# Patient Record
Sex: Male | Born: 1965 | Race: White | Hispanic: No | Marital: Married | Smoking: Never smoker
Health system: Southern US, Community
[De-identification: ages and names within clinical notes are randomized; demographics above are authoritative.]

## PROBLEM LIST (undated history)

## (undated) DIAGNOSIS — K5090 Crohn's disease, unspecified, without complications: Secondary | ICD-10-CM

## (undated) DIAGNOSIS — D531 Other megaloblastic anemias, not elsewhere classified: Secondary | ICD-10-CM

## (undated) HISTORY — DX: Other megaloblastic anemias, not elsewhere classified: D53.1

## (undated) HISTORY — PX: OTHER SURGICAL HISTORY: SHX169

## (undated) HISTORY — DX: Crohn's disease, unspecified, without complications: K50.90

---

## 2005-05-31 ENCOUNTER — Ambulatory Visit: Payer: Self-pay | Admitting: Gastroenterology

## 2006-07-02 ENCOUNTER — Ambulatory Visit: Payer: Self-pay | Admitting: Gastroenterology

## 2006-07-02 LAB — CONVERTED CEMR LAB
ALT: 24 units/L (ref 0–53)
Basophils Absolute: 0 10*3/uL (ref 0.0–0.1)
Bilirubin, Direct: 0.1 mg/dL (ref 0.0–0.3)
Calcium: 9.1 mg/dL (ref 8.4–10.5)
GFR calc Af Amer: 94 mL/min
GFR calc non Af Amer: 78 mL/min
Glucose, Bld: 89 mg/dL (ref 70–99)
Hemoglobin: 14.1 g/dL (ref 13.0–17.0)
MCHC: 33.9 g/dL (ref 30.0–36.0)
Monocytes Absolute: 0.7 10*3/uL (ref 0.2–0.7)
Monocytes Relative: 8.9 % (ref 3.0–11.0)
RBC: 4.7 M/uL (ref 4.22–5.81)
RDW: 11.8 % (ref 11.5–14.6)
Vitamin B-12: >1500 pg/mL — ABNORMAL HIGH (ref 211–911)

## 2007-04-26 DIAGNOSIS — R197 Diarrhea, unspecified: Secondary | ICD-10-CM | POA: Insufficient documentation

## 2007-04-26 DIAGNOSIS — K56609 Unspecified intestinal obstruction, unspecified as to partial versus complete obstruction: Secondary | ICD-10-CM | POA: Insufficient documentation

## 2007-04-26 DIAGNOSIS — D649 Anemia, unspecified: Secondary | ICD-10-CM

## 2007-08-08 ENCOUNTER — Telehealth: Payer: Self-pay | Admitting: Gastroenterology

## 2007-08-12 ENCOUNTER — Ambulatory Visit: Payer: Self-pay | Admitting: Gastroenterology

## 2007-08-12 DIAGNOSIS — K5000 Crohn's disease of small intestine without complications: Secondary | ICD-10-CM | POA: Insufficient documentation

## 2007-08-13 ENCOUNTER — Encounter: Payer: Self-pay | Admitting: Gastroenterology

## 2007-08-13 LAB — CONVERTED CEMR LAB
Alkaline Phosphatase: 52 units/L (ref 39–117)
BUN: 17 mg/dL (ref 6–23)
Basophils Relative: 0.9 % (ref 0.0–3.0)
Eosinophils Relative: 0.8 % (ref 0.0–5.0)
GFR calc Af Amer: 85 mL/min
GFR calc non Af Amer: 70 mL/min
Glucose, Bld: 93 mg/dL (ref 70–99)
HCT: 42.5 % (ref 39.0–52.0)
Hemoglobin: 14.7 g/dL (ref 13.0–17.0)
Monocytes Absolute: 0.8 10*3/uL (ref 0.1–1.0)
Monocytes Relative: 11.5 % (ref 3.0–12.0)
Neutro Abs: 3.7 10*3/uL (ref 1.4–7.7)
RBC: 4.74 M/uL (ref 4.22–5.81)
RDW: 11.8 % (ref 11.5–14.6)
Sed Rate: 12 mm/hr (ref 0–16)
Total Bilirubin: 1 mg/dL (ref 0.3–1.2)
WBC: 6.9 10*3/uL (ref 4.5–10.5)

## 2008-05-14 ENCOUNTER — Ambulatory Visit: Payer: Self-pay | Admitting: Gastroenterology

## 2008-05-14 DIAGNOSIS — R1011 Right upper quadrant pain: Secondary | ICD-10-CM | POA: Insufficient documentation

## 2008-05-17 LAB — CONVERTED CEMR LAB
ALT: 45 units/L (ref 0–53)
AST: 31 units/L (ref 0–37)
Albumin: 4 g/dL (ref 3.5–5.2)
Basophils Absolute: 0 10*3/uL (ref 0.0–0.1)
Basophils Relative: 0.8 % (ref 0.0–3.0)
CO2: 31 meq/L (ref 19–32)
Calcium: 9.4 mg/dL (ref 8.4–10.5)
Chloride: 109 meq/L (ref 96–112)
Creatinine, Ser: 1.1 mg/dL (ref 0.4–1.5)
GFR calc non Af Amer: 77.26 mL/min (ref >60)
Hemoglobin: 13.8 g/dL (ref 13.0–17.0)
Lymphocytes Relative: 29.7 % (ref 12.0–46.0)
Monocytes Relative: 8.8 % (ref 3.0–12.0)
Neutro Abs: 3.2 10*3/uL (ref 1.4–7.7)
Neutrophils Relative %: 59.4 % (ref 43.0–77.0)
Potassium: 3.8 meq/L (ref 3.5–5.1)
RBC: 4.32 M/uL (ref 4.22–5.81)
RDW: 11.8 % (ref 11.5–14.6)

## 2008-05-18 ENCOUNTER — Telehealth (INDEPENDENT_AMBULATORY_CARE_PROVIDER_SITE_OTHER): Payer: Self-pay | Admitting: *Deleted

## 2008-05-20 ENCOUNTER — Ambulatory Visit (HOSPITAL_COMMUNITY): Admission: RE | Admit: 2008-05-20 | Discharge: 2008-05-20 | Payer: Self-pay | Admitting: Gastroenterology

## 2008-05-20 ENCOUNTER — Telehealth: Payer: Self-pay | Admitting: Gastroenterology

## 2008-05-21 ENCOUNTER — Telehealth: Payer: Self-pay | Admitting: Gastroenterology

## 2009-08-01 HISTORY — PX: ACHILLES TENDON REPAIR: SUR1153

## 2009-08-01 HISTORY — PX: FOOT SURGERY: SHX648

## 2009-12-02 IMAGING — US US ABDOMEN COMPLETE
1 series · 13 of 25 positions shown · non-contrast
Comparison: None

CLINICAL DATA: Right upper quadrant pain, history of Crohn's
disease

ABDOMEN ULTRASOUND
TECHNIQUE: Abdominal ultrasound

[Series 1: us abdomen complete · 0.28mm/px · 13 of 98 slices shown]
[im 1/98]
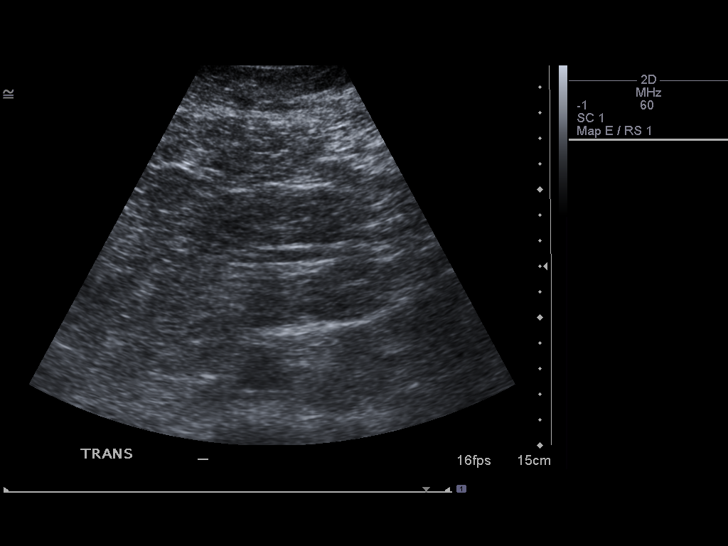
[im 9/98]
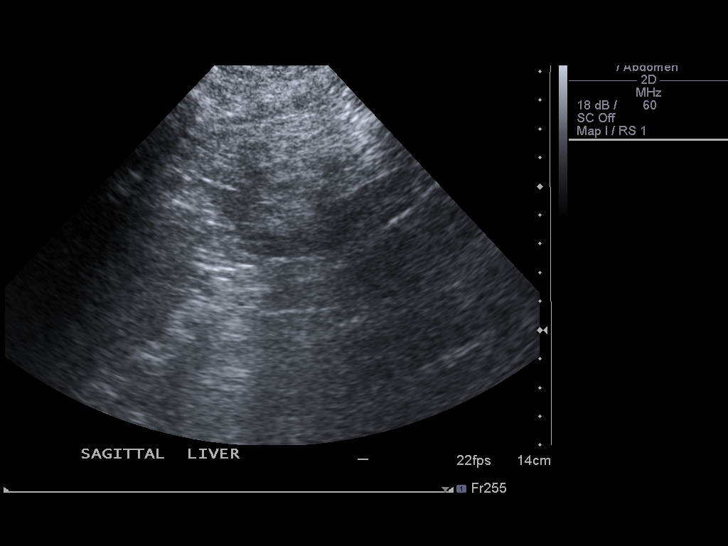
[im 17/98]
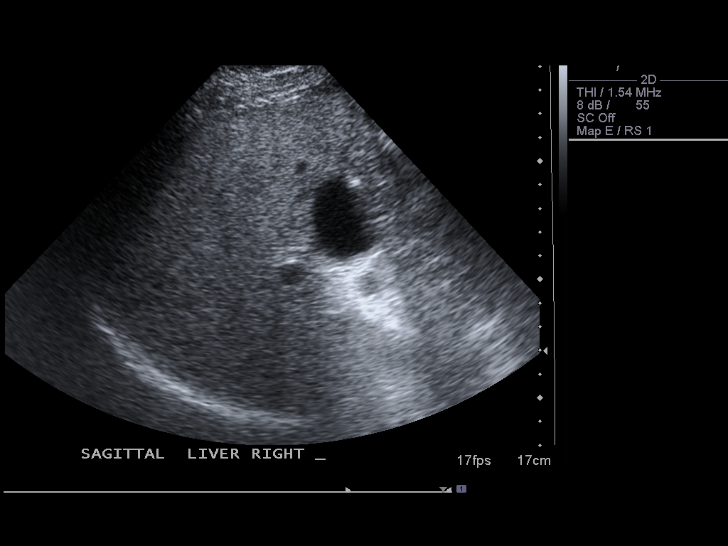
[im 25/98]
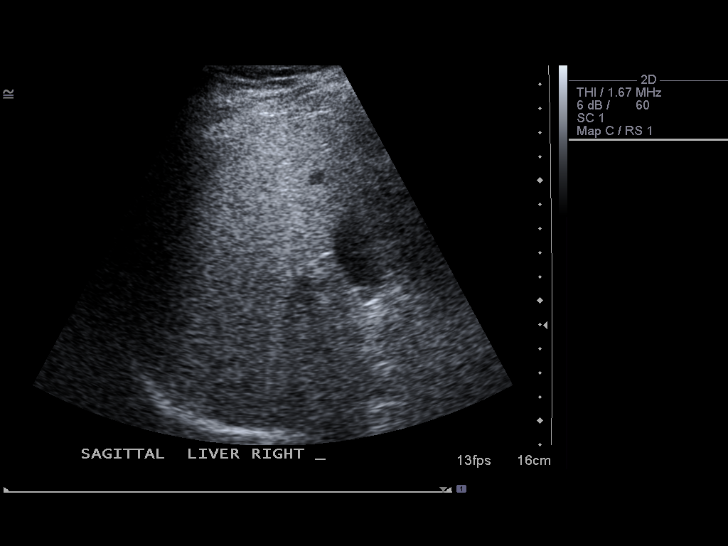
[im 33/98]
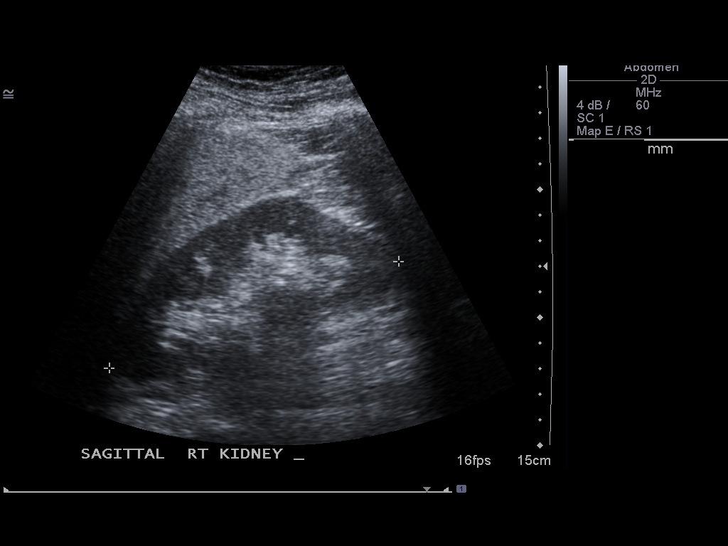
[im 41/98]
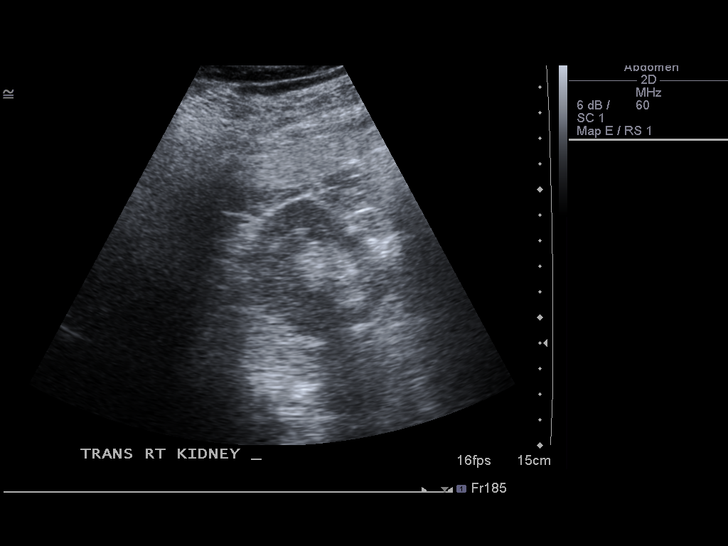
[im 49/98]
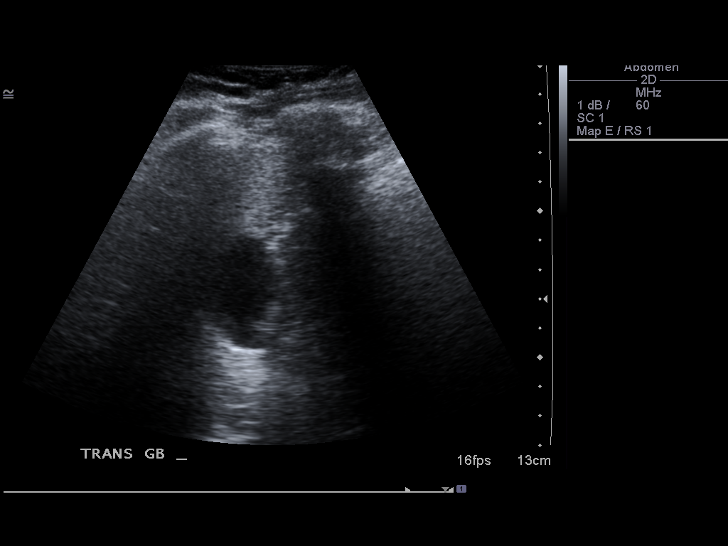
[im 57/98]
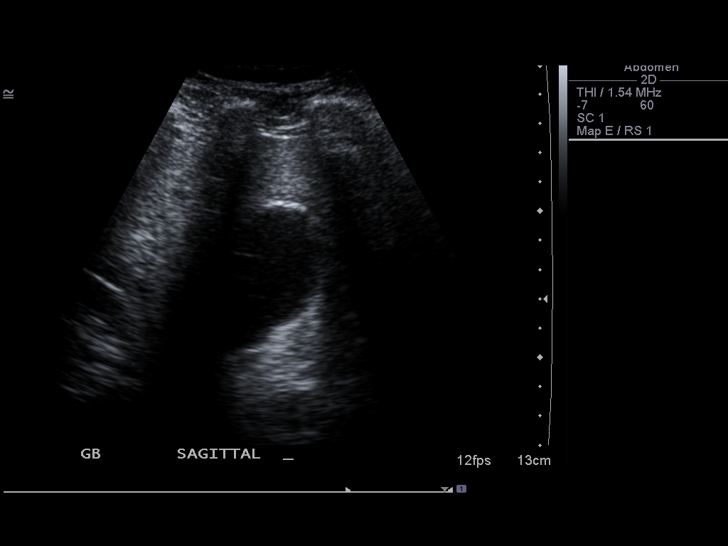
[im 65/98]
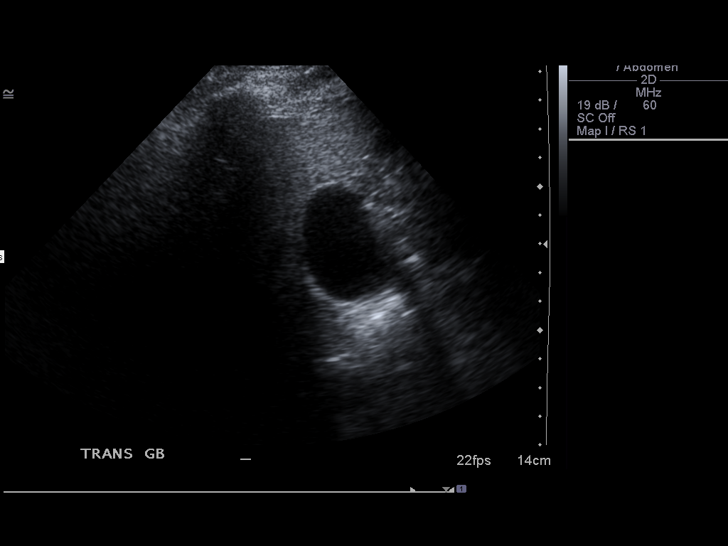
[im 73/98]
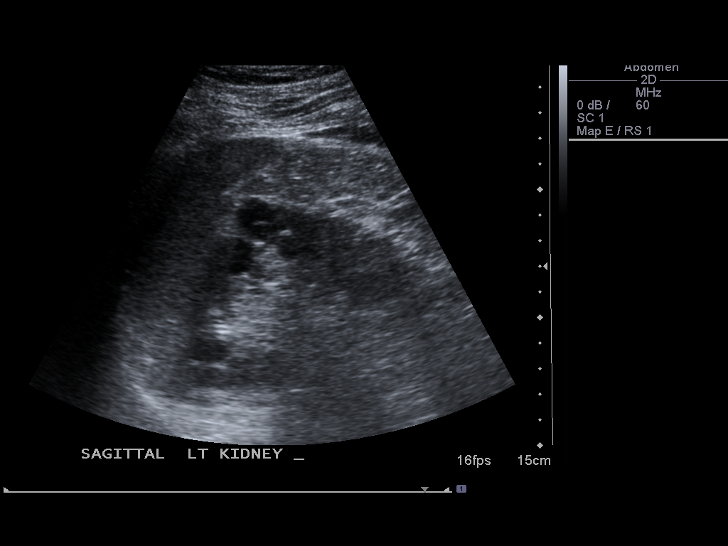
[im 81/98]
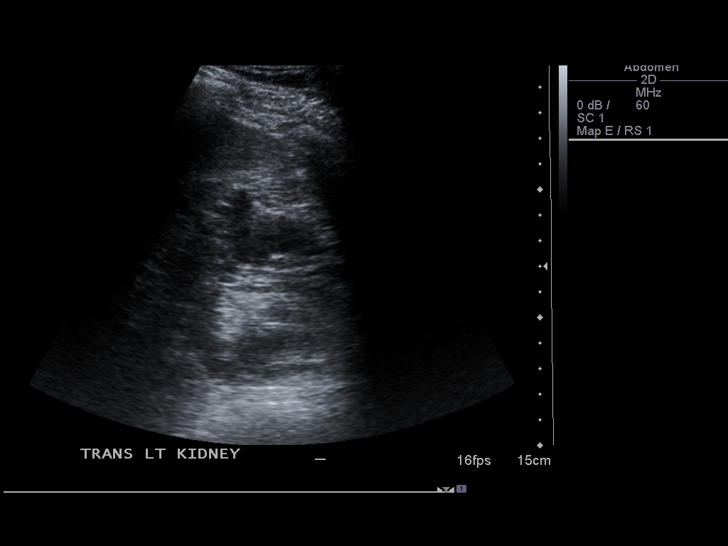
[im 89/98]
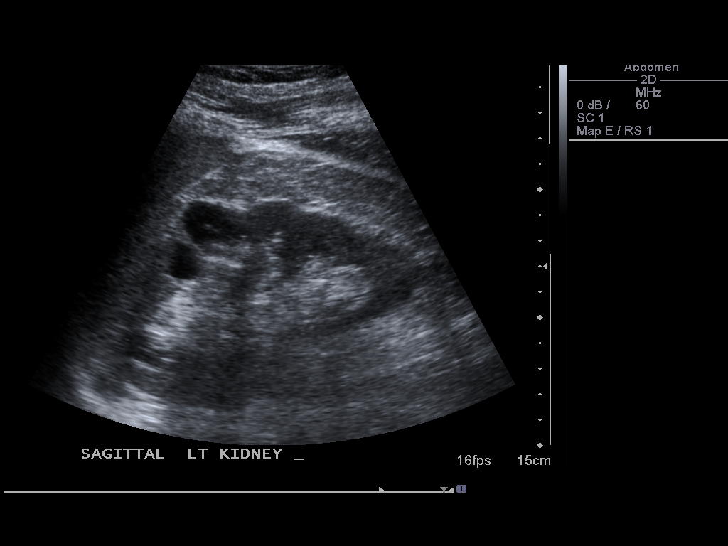
[im 98/98]
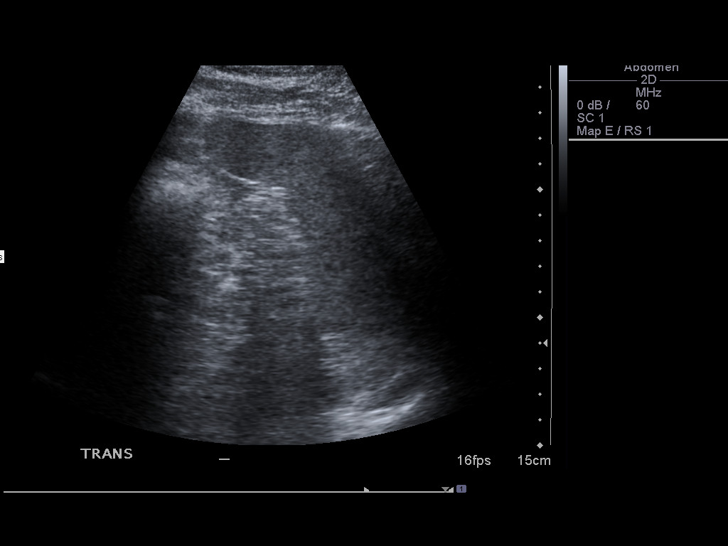

[13 of 25 positions shown; findings below may reference images not displayed]

FINDINGS: The liver shows diffuse increased echogenicity.  Findings
probable due to fatty infiltration.  There is limited assessment on
visualization of the left hepatic lobe.

No gallstones are noted within gallbladder.  CBD measures 4.1 mm in
diameter within normal limits.

IVC is not visualized.

Pancreas is not visualized.

The study is limited by abundant bowel gas.

The spleen shows normal echogenicity measures 8 cm in length.

Right kidney measures 12 cm in length.  No hydronephrosis or
diagnostic renal calculus.

Left kidney measures 12.9 cm in length.  A cyst in mid pole
measures 1 x 1.5 cm.  A probable complex cyst in mid pole laterally
measures 2.0 cm.  Follow-up ultrasound in 3 months is recommended
to assure stability.

No hydronephrosis or diagnostic renal calculus.

Abdominal aorta measures up to 2.1 cm in diameter.
IMPRESSION: 1.  Limited assessment due to abundant bowel gas.  Probable fatty
infiltration of the liver.  Left hepatic lobe is poorly visualized.
2.  No gallstones are noted within gallbladder.
3.  Pancreas and IVC are not visualized.
4.  No hydronephrosis or diagnostic renal calculus.  A cyst is
noted mid pole of the left kidney measures 1.4 cm.  Probable
complex cyst mid pole of the left kidney measures 2 cm.  Follow-up
ultrasound in 3 months is recommended to assure stability.

## 2010-01-23 ENCOUNTER — Encounter: Payer: Self-pay | Admitting: Gastroenterology

## 2010-03-10 ENCOUNTER — Telehealth: Payer: Self-pay | Admitting: Gastroenterology

## 2010-03-14 NOTE — Progress Notes (Signed)
Summary: Medication refill  Phone Note Call from Patient Call back at Home Phone [phone]   Caller: Patient Call For: Dr. Christella Hartigan Summary of Call: Needs a new script for Pensasa 90 day supply mail order...[facility] [phone] Initial call taken by: Karna Christmas,  March 10, 2010 3:47 PM  Follow-up for Phone Call        pt aware Follow-up by: Chales Abrahams CMA Duncan Dull),  March 10, 2010 4:57 PM    Prescriptions: PENTASA 500 MG  CPCR (MESALAMINE) take 1 pill twice daily  #180 x 3   Entered by:   Chales Abrahams CMA (AAMA)   Authorized by:   Rachael Fee MD   Signed by:   Chales Abrahams CMA (AAMA) on 03/10/2010   Method used:   Electronically to        [facility]* (mail-order)       [address] [place], [place]  [address]       Ph: [phone]       Fax: [phone]   RxID:   [id number]

## 2010-03-23 ENCOUNTER — Telehealth: Payer: Self-pay | Admitting: Gastroenterology

## 2010-03-23 NOTE — Telephone Encounter (Signed)
Pt scheduled for an appointment with Dr Christella Hartigan and was also put on the wait list.  I advised pt to call if symptoms worsen.

## 2010-05-03 ENCOUNTER — Other Ambulatory Visit (INDEPENDENT_AMBULATORY_CARE_PROVIDER_SITE_OTHER): Payer: Federal, State, Local not specified - PPO

## 2010-05-03 ENCOUNTER — Ambulatory Visit (INDEPENDENT_AMBULATORY_CARE_PROVIDER_SITE_OTHER): Payer: Federal, State, Local not specified - PPO | Admitting: Gastroenterology

## 2010-05-03 ENCOUNTER — Other Ambulatory Visit: Payer: Self-pay

## 2010-05-03 ENCOUNTER — Encounter: Payer: Self-pay | Admitting: Gastroenterology

## 2010-05-03 VITALS — BP 136/82 | HR 72 | Ht 71.0 in | Wt 220.0 lb

## 2010-05-03 DIAGNOSIS — R1011 Right upper quadrant pain: Secondary | ICD-10-CM

## 2010-05-03 LAB — COMPREHENSIVE METABOLIC PANEL
ALT: 57 U/L — ABNORMAL HIGH (ref 0–53)
AST: 31 U/L (ref 0–37)
Albumin: 3.9 g/dL (ref 3.5–5.2)
Alkaline Phosphatase: 60 U/L (ref 39–117)
Calcium: 9 mg/dL (ref 8.4–10.5)
Chloride: 105 mEq/L (ref 96–112)
Potassium: 4 mEq/L (ref 3.5–5.1)
Sodium: 142 mEq/L (ref 135–145)
Total Protein: 7.1 g/dL (ref 6.0–8.3)

## 2010-05-03 LAB — CBC WITH DIFFERENTIAL/PLATELET
Basophils Absolute: 0 10*3/uL (ref 0.0–0.1)
Basophils Relative: 0.4 % (ref 0.0–3.0)
Eosinophils Absolute: 0.1 10*3/uL (ref 0.0–0.7)
Lymphocytes Relative: 34 % (ref 12.0–46.0)
MCHC: 34 g/dL (ref 30.0–36.0)
MCV: 89.8 fl (ref 78.0–100.0)
Monocytes Absolute: 0.6 10*3/uL (ref 0.1–1.0)
Neutrophils Relative %: 54.1 % (ref 43.0–77.0)
Platelets: 169 10*3/uL (ref 150.0–400.0)
RBC: 4.43 Mil/uL (ref 4.22–5.81)
RDW: 12.9 % (ref 11.5–14.6)

## 2010-05-03 MED ORDER — MESALAMINE ER 500 MG PO CPCR: 500.0000 mg | ORAL_CAPSULE | Freq: Two times a day (BID) | ORAL | Status: DC

## 2010-05-03 NOTE — Telephone Encounter (Signed)
Refill pentasa

## 2010-05-03 NOTE — Progress Notes (Signed)
Review of gastrointestinal problems:  1. Likely Crohn's disease. Diagnosed in 1984, presenting with diarrhea, anemia, and weight loss. Underwent what sounds like an ileocectomy in 1987, small bowel obstruction surgery 1992. Most recently for the past 10 to 12 years he has been on 500 mg twice daily of Pentasa without any GI symptoms. [place] colon[place] August 2004 [place] found normal colon, friable but otherwise normal IC anastomosis. August, 2009 I recommended he given its limited small bowel fracture and lack of any empiric evidence of colonic disease, he declined. 2, Intermittent RUQ pains, 2010: abd US showed no gallstones, normal LFTs, recommended HIDA but he declined.  HPI: This is a very pleasant 45 year old man whom I last saw 1 or 2 years ago. He has been having a return of his right upper quadrant discomfort.   Had RUQ pressure then nausea, vomitting.  Fried foods tend to bring it on.  He declined HIDA in past.  The pais had gone away but have returned in past 6 months.  Occurs with most meals, at least 1-2 times a day.  Can last 30 min.  No problems with bowels.  No constipation, no diarrhea.     Physical Exam: BP 136/82  Pulse 72  Ht 5\' 11"  (1.803 m)  Wt 220 lb (99.791 kg)  BMI 30.68 kg/m2 Constitutional: generally well-appearing Psychiatric: alert and oriented x3 Abdomen: soft, nontender, nondistended, no obvious ascites, no peritoneal signs, normal bowel sounds    Assessment and plan: 45 y.o. male with right upper quadrant discomfort, intermittent.  This sounds biliary in origin. I once again recommended that we proceed with a HIDA scan to estimate his gallbladder ejection fraction. This time he is interested in going ahead with the test. He lost the basis of labs including a CBC, complete metabolic profile, B12 level. He wanted B12 shot today I will like to check to see if he is deficient first. If the HIDA scan is negative then I think we should proceed with  upper endoscopy.

## 2010-05-03 NOTE — Patient Instructions (Signed)
You will have labs checked today in the basement lab.  Please head down after you check out with the front desk  (cbc, cmet, B12 level). HIDA scan with CCK to estimate gallbladder ejection fraction (how well your GB works).

## 2010-05-16 NOTE — Assessment & Plan Note (Signed)
[facility]                         GASTROENTEROLOGY OFFICE NOTE   NAME:Cody Maynard, Cody Maynard                       MRN:          [id number]  DATE:07/02/2006                            DOB:          [birth year]    PRIMARY CARE PHYSICIAN:  Dr. Janece Canterbury.   GI PROBLEM LIST:  1. Likely Crohn's disease.  Diagnosed in 1984, presenting with      diarrhea, anemia, and weight loss. Underwent what sounds like an      ileocectomy in 1987, small bowel obstruction surgery 1992. Most      recently for the past 10 to 12 years he has been on 500 mg twice      daily of Pentasa without any GI symptoms.  Most recent colonoscopy      August 2004 in [place] found normal colon, friable but otherwise      normal IC anastomosis.   INTERVAL HISTORY:  I last saw Cody Maynard a little over a year ago,  that was for his initial evaluation.  He continues to do very well on  500 mg of Pentasa twice a day.  Specifically he has 2 soft formed brown  bowel movements everyday.  He has no diarrhea, no constipation, no  significant abdominal pains.  His weight has been relatively stable.  He  has had no eye symptoms.  He does feel like he has a sinus infection  right now and says a Z-Pak almost  always clears him up immediately  He  is out of Pentasa.  I quizzed him again and he is very specific that he  has never been told that he had colitis on any of his previous workups.   CURRENT MEDICINES:  Pentasa 500 mg twice daily.   PHYSICAL EXAMINATION:  Weight 204 pounds, blood pressure 122/80, pulse  64.  CONSTITUTIONAL:  Generally well-appearing.  NEUROLOGICAL:  Alert and oriented x4.  LUNGS:  Clear to auscultation bilaterally.  HEART:  Regular rate and rhythm.  ABDOMEN:  Soft, nontender, normal bowel sounds.   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   First he has never been told he has Crohn's colitis and his most recent  colonoscopy in 2004 said specifically that he did not have any  inflammation in his colon, so I think that his next colonoscopy would be  for routine screening purposes in 2014.  I will refill his Pentasa and I  will also give him a Z-Pak for his likely sinus infection.  He requested  B12 shot but I think that every other month is more than enough for him  for B12 since his B12 level when I checked him a year  ago was greater than 1500, that was on once monthly shots.  He will  follow up with me in 12 months or sooner if needed.     Rachael Fee, MD  Electronically Signed    DPJ/MedQ  DD: 07/02/2006  DT: 07/03/2006  Job #: [id number]   cc:   Janece Canterbury

## 2010-05-17 ENCOUNTER — Encounter (HOSPITAL_COMMUNITY)
Admission: RE | Admit: 2010-05-17 | Discharge: 2010-05-17 | Disposition: A | Discharge status: Home / Self Care | Payer: Federal, State, Local not specified - PPO | Source: Ambulatory Visit | Attending: Gastroenterology | Admitting: Gastroenterology

## 2010-05-17 DIAGNOSIS — R1011 Right upper quadrant pain: Secondary | ICD-10-CM | POA: Insufficient documentation

## 2010-05-17 MED ORDER — SINCALIDE 5 MCG IJ SOLR: 0.0200 ug/kg | Freq: Once | INTRAMUSCULAR | Status: DC

## 2010-05-17 MED ORDER — TECHNETIUM TC 99M MEBROFENIN IV KIT: 5.0000 | PACK | Freq: Once | INTRAVENOUS | Status: AC | PRN

## 2010-05-19 ENCOUNTER — Other Ambulatory Visit: Payer: Self-pay | Admitting: Gastroenterology

## 2010-05-19 ENCOUNTER — Telehealth: Payer: Self-pay | Admitting: Gastroenterology

## 2010-05-19 DIAGNOSIS — R1011 Right upper quadrant pain: Secondary | ICD-10-CM

## 2010-05-19 NOTE — Telephone Encounter (Signed)
See result note dated 05/19/10

## 2010-08-25 ENCOUNTER — Other Ambulatory Visit: Payer: Self-pay | Admitting: Gastroenterology

## 2010-11-10 ENCOUNTER — Other Ambulatory Visit: Payer: Self-pay | Admitting: Gastroenterology

## 2010-11-15 ENCOUNTER — Telehealth: Payer: Self-pay | Admitting: Gastroenterology

## 2010-11-15 DIAGNOSIS — E538 Deficiency of other specified B group vitamins: Secondary | ICD-10-CM

## 2010-11-15 NOTE — Telephone Encounter (Signed)
Dr Christella Hartigan can we refill his injectable B12 it looks like you stated he does not need this per his lab.

## 2010-11-16 NOTE — Telephone Encounter (Signed)
Pt aware to have labs  

## 2010-11-16 NOTE — Telephone Encounter (Signed)
b12 level 6 mo ago was normal.  Lets check b12 again to know if he needs injections.

## 2010-11-27 ENCOUNTER — Other Ambulatory Visit (INDEPENDENT_AMBULATORY_CARE_PROVIDER_SITE_OTHER): Payer: Federal, State, Local not specified - PPO

## 2010-11-27 DIAGNOSIS — E538 Deficiency of other specified B group vitamins: Secondary | ICD-10-CM

## 2010-11-27 LAB — VITAMIN B12: Vitamin B-12: 586 pg/mL (ref 211–911)

## 2010-11-28 ENCOUNTER — Telehealth: Payer: Self-pay | Admitting: Gastroenterology

## 2010-11-28 NOTE — Telephone Encounter (Signed)
Left message on machine to call back see result note

## 2011-03-14 ENCOUNTER — Telehealth: Payer: Self-pay | Admitting: Gastroenterology

## 2011-03-14 DIAGNOSIS — N281 Cyst of kidney, acquired: Secondary | ICD-10-CM

## 2011-03-14 NOTE — Telephone Encounter (Signed)
Left message on machine to call back  

## 2011-03-14 NOTE — Telephone Encounter (Signed)
Pt is having pain and would like to see someone regarding pain in his kidney.  Pt had [place] here last year and it showed a cyst on the kidney.  I am sending a referral and records to [facility] for the pt

## 2011-06-08 ENCOUNTER — Other Ambulatory Visit: Payer: Self-pay | Admitting: Gastroenterology

## 2011-08-02 ENCOUNTER — Telehealth: Payer: Self-pay | Admitting: Gastroenterology

## 2011-08-02 MED ORDER — MESALAMINE ER 500 MG PO CPCR: 500.0000 mg | ORAL_CAPSULE | Freq: Two times a day (BID) | ORAL | Status: DC

## 2011-08-02 NOTE — Telephone Encounter (Signed)
Medication sent to the mail order phrmacy

## 2011-11-29 IMAGING — NM NM HEPATO W/GB/PHARM/[PERSON_NAME]
1 series · 12 of 12 positions shown · non-contrast
Comparison: Abdominal ultrasound from 05/20/2008

CLINICAL DATA: Evaluate gallbladder function.

NUCLEAR MEDICINE HEPATOBILIARY IMAGING WITH GALLBLADDER EF
TECHNIQUE: Sequential images of the abdomen were obtained [DATE] minutes following intravenous administration of
radiopharmaceutical.  After slow intravenous infusion of
micrograms Cholecystokinin, gallbladder ejection fraction was
determined.
Radiopharmaceutical:  5.0 mCi Rc-RRm Choletec

[Series 1: antr · 4.46mm/px · 2 acquisitions, 12 frames shown]
[im 1/2]
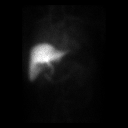
[im 1/2]
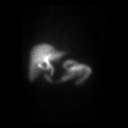
[im 1/2]
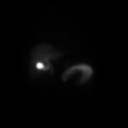
[im 1/2]
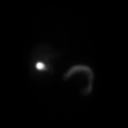
[im 1/2]
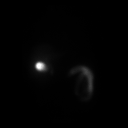
[im 1/2]
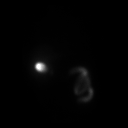
[im 2/2]
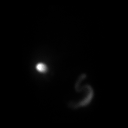
[im 2/2]
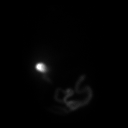
[im 2/2]
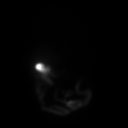
[im 2/2]
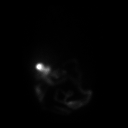
[im 2/2]
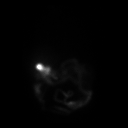
[im 2/2]
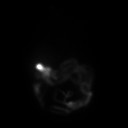

[12 of 12 positions shown; findings below may reference images not displayed]

FINDINGS: Radiopharmaceutical was taken up by the liver and
excreted into the biliary system.  There is activity within the
gallbladder and small bowel.  Gallbladder activity by 20 minutes.
The gallbladder ejection fraction is 39.4%.  Normal gallbladder
ejection fraction is greater than 30%.

The patient did experience symptoms during CCK infusion.
IMPRESSION: Normal gallbladder ejection fraction measuring 39%.

## 2011-12-10 ENCOUNTER — Telehealth: Payer: Self-pay | Admitting: Gastroenterology

## 2011-12-10 DIAGNOSIS — E538 Deficiency of other specified B group vitamins: Secondary | ICD-10-CM

## 2011-12-10 NOTE — Telephone Encounter (Signed)
Pt is due to have B12 level I have put the order in and he will come in and have that done this week

## 2011-12-14 ENCOUNTER — Other Ambulatory Visit (INDEPENDENT_AMBULATORY_CARE_PROVIDER_SITE_OTHER): Payer: Federal, State, Local not specified - PPO

## 2011-12-14 DIAGNOSIS — E538 Deficiency of other specified B group vitamins: Secondary | ICD-10-CM

## 2011-12-19 ENCOUNTER — Telehealth: Payer: Self-pay | Admitting: Gastroenterology

## 2011-12-19 NOTE — Telephone Encounter (Signed)
I have left a message for the patient to call back with any questions, but the results were normal .  See lab results from 12/10/11

## 2011-12-20 ENCOUNTER — Encounter: Payer: Self-pay | Admitting: Internal Medicine

## 2011-12-20 ENCOUNTER — Telehealth: Payer: Self-pay | Admitting: Gastroenterology

## 2011-12-20 NOTE — Telephone Encounter (Signed)
Pt was given the B 12 value

## 2013-06-01 ENCOUNTER — Ambulatory Visit (INDEPENDENT_AMBULATORY_CARE_PROVIDER_SITE_OTHER): Payer: Federal, State, Local not specified - PPO | Admitting: Internal Medicine

## 2013-06-01 ENCOUNTER — Encounter: Payer: Self-pay | Admitting: Internal Medicine

## 2013-06-01 VITALS — BP 142/98 | HR 88 | Temp 98.4°F | Resp 12 | Ht 71.5 in | Wt 231.0 lb

## 2013-06-01 DIAGNOSIS — N200 Calculus of kidney: Secondary | ICD-10-CM

## 2013-06-01 DIAGNOSIS — E213 Hyperparathyroidism, unspecified: Secondary | ICD-10-CM

## 2013-06-01 LAB — PHOSPHORUS: Phosphorus: 2.9 mg/dL (ref 2.3–4.6)

## 2013-06-01 LAB — MAGNESIUM: MAGNESIUM: 1.8 mg/dL (ref 1.5–2.5)

## 2013-06-01 NOTE — Patient Instructions (Signed)
Please stop at the lab. Please collect a urine collection - but only after we have the blood results back. I will let you know. Please try to join MyChart for easier communication. I will send you the labs through there.  Patient information (Up-to-Date): Collection of a 24-hour urine specimen  - You should collect every drop of urine during each 24-hour period. It does not matter how much or little urine is passed each time, as long as every drop is collected. - Begin the urine collection in the morning after you wake up, after you have emptied your bladder for the first time. - Urinate (empty the bladder) for the first time and flush it down the toilet. Note the exact time (eg, 6:15 AM). You will begin the urine collection at this time. - Collect every drop of urine during the day and night in an empty collection bottle. Store the bottle at room temperature or in the refrigerator. - If you need to have a bowel movement, any urine passed with the bowel movement should be collected. Try not to include feces with the urine collection. If feces does get mixed in, do not try to remove the feces from the urine collection bottle. - Finish by collecting the first urine passed the next morning, adding it to the collection bottle. This should be within ten minutes before or after the time of the first morning void on the first day (which was flushed). In this example, you would try to void between 6:05 and 6:25 on the second day. - If you need to urinate one hour before the final collection time, drink a full glass of water so that you can void again at the appropriate time. If you have to urinate 20 minutes before, try to hold the urine until the proper time. - Please note the exact time of the final collection, even if it is not the same time as when collection began on day 1. - The bottle(s) may be kept at room temperature for a day or two, but should be kept cool or refrigerated for longer periods of  time.  Please return in 3 months.

## 2013-06-01 NOTE — Progress Notes (Addendum)
Patient ID: Cody Maynard, male   DOB: [birth year], 48 y.o.   MRN: [id number]   HPI  Cody Maynard is a 48 y.o.-year-old male, referred by his urologist, Dr Mena GoesEskridge, in consultation for  Normocalcemic hyperparathyroidism.  Pt was dx with hypercalcemia on 05/18/2013, at his urologist's office - labs reviewed: PTH 80.6, calcium 9.7 Lab Results  Component Value Date   CALCIUM 9.0 05/03/2010   CALCIUM 9.4 05/14/2008   CALCIUM 9.9 08/12/2007   CALCIUM 9.1 07/02/2006   No h/o vitamin D deficiency, but no vit D level available for review.  No fractures or falls.   + significant h/o bilateral kidney stones, for which he is followed by Dr Mena GoesEskridge. He passed the first stone 1.5 years ago and 1/2 stone 1 mo ago. I do not have a stone analysis report available.  No h/o CKD. Last BMP: Glu 73, BUN/Cr 14/1.19, Na 140, K 3.9, Cl 103, CO2 27, Ca 9.7, GFR 71. He had a high Uric acid: 9.5 (4-7.8) Lab Results  Component Value Date   BUN 11 05/03/2010   CREATININE 1.3 05/03/2010   Pt is on calcium and vitamin D (one a day vitamin); he also eats limited amts of dairy (he is lactose intolerant) and limited green, leafy, vegetables - b/c (small intestine) Crohns ds - has a h/o small bowel resection in 1989 and 1992  - in remission since then.   Pt does not have a FH of hypercalcemia, pituitary tumors, thyroid cancer. His father had Paget's ds.   ROS: Constitutional: no weight gain/loss, no fatigue, no subjective hyperthermia/hypothermia Eyes: no blurry vision, no xerophthalmia ENT: no sore throat, no nodules palpated in throat, no dysphagia/odynophagia, no hoarseness Cardiovascular: no CP/SOB/palpitations/leg swelling Respiratory: no cough/SOB Gastrointestinal: no N/V/D/C Musculoskeletal: no muscle/joint aches Skin: no rashes Neurological: no tremors/numbness/tingling/dizziness Psychiatric: no depression/anxiety  Past Medical History  Diagnosis Date  . Crohn's   . Vitamin B12 deficient  megaloblastic anemia    Past Surgical History  Procedure Laterality Date  . Ileocectomy      1987  . Small bowel obstruction surgery      1992  . Foot surgery  08/2009    heal and bone spur  . Achilles tendon repair  08/2009   History   Social History  . Marital Status: Married    Spouse Name: N/A    Number of Children: 3   Occupational History  . Manager [employer]   Social History Main Topics  . Smoking status: Never Smoker   . Smokeless tobacco: Never Used  . Alcohol Use: Not on file     Comment: OCCASIONALLY  . Drug Use: No   Current Outpatient Prescriptions on File Prior to Visit  Medication Sig Dispense Refill  . CYANOCOBALAMIN IJ Inject 1,000 mg/mL as directed every 30 (thirty) days.        . mesalamine (PENTASA) 500 MG CR capsule Take 1 capsule (500 mg total) by mouth 2 (two) times daily.  180 capsule  3   No current facility-administered medications on file prior to visit.   Allergies  Allergen Reactions  . Penicillins     REACTION: sob   Family History  Problem Relation Age of Onset  . Heart disease Father     PE: BP 142/98  Pulse 88  Temp(Src) 98.4 F (36.9 C) (Oral)  Resp 12  Ht 5' 11.5" (1.816 m)  Wt 231 lb (104.781 kg)  BMI 31.77 kg/m2  SpO2 97% Wt Readings from Last 3 Encounters:  06/01/13 231 lb (104.781 kg)  05/03/10 220 lb (99.791 kg)  05/14/08 221 lb (100.245 kg)   Constitutional: overweight, in NAD. No kyphosis. Eyes: PERRLA, EOMI, no exophthalmos ENT: moist mucous membranes, no thyromegaly, no cervical lymphadenopathy Cardiovascular: RRR, No MRG Respiratory: CTA B Gastrointestinal: abdomen soft, NT, ND, BS+ Musculoskeletal: no deformities, strength intact in all 4 Skin: moist, warm, no rashes Neurological: no tremor with outstretched hands, DTR normal in all 4  Assessment: 1. Hyperparathyroidism  Plan: Patient has had normal calcium levels over the years but repeated nephrolithiasis episodes. He was recently found to  have a PTH level that was high, at 80.6.  It is unclear whether he has vitamin D deficiency >> will check today. He has Crohn's ds, may have a degree of malabsorption. Denies osteoporosis, no fractures. No abdominal pain, depression, bone pain. - I discussed with the patient about the physiology of calcium and parathyroid hormone, and possible side effects from increased PTH, including kidney stones, osteoporosis, abdominal pain, etc.  - He meets criteria for parathyroid surgery in case this is Primary HPTH:  Increased calcium by more than 1 mg/dL above the upper limit of normal  Kidney ds.  Osteoporosis  Age <56 years old - he is 48 y/o - Today we will check: calcium level intact PTH Magnesium Phosphorus vitamin D If vit D normal >> check 24h urinary calcium/creatinine ratio  - If the tests indicate a parathyroid adenoma, I will refer him to surgery. I will wait for the results of the above labs and will discuss with the plan with the patient.  - I will see the patient back in 3 months, I will discuss through my chart or by phone  Component     Latest Ref Rng 06/01/2013  Vitamin D 1, 25 (OH) Total     18 - 72 pg/mL 40  Vitamin D3 1, 25 (OH)      40  Vitamin D2 1, 25 (OH)      <8  PTH     14.0 - 72.0 pg/mL 87.6 (H)  Calcium     8.4 - 10.5 mg/dL 9.5  Vit D, 43-PIRJJOA     30 - 89 ng/mL 32  Phosphorus     2.3 - 4.6 mg/dL 2.9  Magnesium     1.5 - 2.5 mg/dL 1.8   Will go ahead and do the urine collection. Will addend labs when they are back.  Component     Latest Ref Rng 06/03/2013  Calcium, Ur      4  Urine Volume     100 - 250 mg/day 52 (L)  Creatinine, Urine      158.6  Creatinine, 24H Ur     800 - 2000 mg/day 2062 (H)   Urinary calcium low, which can mean to possible scenarios: - Familial hypocalciuric hypercalcemia - less likely with normal serum calcium - calcium malabsorption - most likely, with his h/o Crohn's ds.  Will advise the pt to start vit D OTC 1000 IU  daily and also to add a calcium 500 mg tablet a day.  Will repeat labs when he comes back in 3 mo.

## 2013-06-02 LAB — PTH, INTACT AND CALCIUM
Calcium: 9.5 mg/dL (ref 8.4–10.5)
PTH: 87.6 pg/mL — AB (ref 14.0–72.0)

## 2013-06-02 LAB — VITAMIN D 25 HYDROXY (VIT D DEFICIENCY, FRACTURES): Vit D, 25-Hydroxy: 32 ng/mL (ref 30–89)

## 2013-06-03 ENCOUNTER — Other Ambulatory Visit: Payer: Federal, State, Local not specified - PPO

## 2013-06-03 LAB — VITAMIN D 1,25 DIHYDROXY
VITAMIN D3 1, 25 (OH): 40 pg/mL
Vitamin D 1, 25 (OH)2 Total: 40 pg/mL (ref 18–72)
Vitamin D2 1, 25 (OH)2: <8 pg/mL

## 2013-06-04 ENCOUNTER — Encounter: Payer: Self-pay | Admitting: *Deleted

## 2013-06-04 LAB — CREATININE, URINE, 24 HOUR
CREATININE 24H UR: 2062 mg/d — AB (ref 800–2000)
Creatinine, Urine: 158.6 mg/dL

## 2013-06-04 LAB — CALCIUM, URINE, 24 HOUR
CALCIUM UR: 4 mg/dL
Calcium, 24 hour urine: 52 mg/d — ABNORMAL LOW (ref 100–250)

## 2013-06-05 ENCOUNTER — Telehealth: Payer: Self-pay | Admitting: Internal Medicine

## 2013-06-05 NOTE — Telephone Encounter (Signed)
Patient would like to know if his labs are available  Please advise   Thank You :)

## 2013-06-05 NOTE — Telephone Encounter (Signed)
Called pt and advised him of his results. Advised pt that letters were sent as well. Pt understood.

## 2013-10-05 ENCOUNTER — Ambulatory Visit: Payer: Self-pay | Admitting: Podiatry

## 2013-10-19 ENCOUNTER — Ambulatory Visit (INDEPENDENT_AMBULATORY_CARE_PROVIDER_SITE_OTHER): Payer: Federal, State, Local not specified - PPO | Admitting: Podiatry

## 2013-10-19 ENCOUNTER — Ambulatory Visit: Payer: Self-pay

## 2013-10-19 ENCOUNTER — Encounter: Payer: Self-pay | Admitting: Podiatry

## 2013-10-19 ENCOUNTER — Ambulatory Visit (INDEPENDENT_AMBULATORY_CARE_PROVIDER_SITE_OTHER): Payer: Federal, State, Local not specified - PPO

## 2013-10-19 VITALS — BP 184/95 | HR 89 | Resp 14 | Ht 71.0 in | Wt 215.0 lb

## 2013-10-19 DIAGNOSIS — M79671 Pain in right foot: Secondary | ICD-10-CM

## 2013-10-19 DIAGNOSIS — M7751 Other enthesopathy of right foot: Secondary | ICD-10-CM

## 2013-10-19 DIAGNOSIS — M779 Enthesopathy, unspecified: Secondary | ICD-10-CM

## 2013-10-19 DIAGNOSIS — M778 Other enthesopathies, not elsewhere classified: Secondary | ICD-10-CM

## 2013-10-19 MED ORDER — PREDNISONE 10 MG PO KIT: PACK | ORAL | Status: DC

## 2013-10-19 NOTE — Progress Notes (Signed)
   Subjective:    Patient ID: Cody Maynard, male    DOB: 02-Jun-1965, 48 y.o.   MRN: [id number]  HPI Comments: N foot pain L right 2nd toe and 2 - 4 th MPJ pain D 2.5 weeks O tried shoe with higher arch for one week C swelling, sharp pain A pressure, or walking especially pushing off T decreases exercise, ice and compression sock at night  Foot Pain   Patient describes more standing walking associated with job recently. He inserted an over-the-counter arch support and notice some right foot pain and remove the arch support. The pain persisted after removal arch support. He describes swelling in the right foot which is resolved at this time. He denies any other area of bone or joint pain.   Review of Systems  All other systems reviewed and are negative.      Objective:   Physical Exam   Orientated x3  Vascular: DP and PT pulses 2/4 bilaterally  Neurological: Sensation to 10 g monofilament wire intact 5/5 bilaterally Vibratory sensation intact bilaterally Ankle reflex equal and reactive bilaterally  Dermatological: Texture and turgor within normal limits No skin lesions noted bilaterally No nedema noted bilaterally  Musculoskeletal Tenderness on range of motion second right MPJ without crepitus or restriction Palpable tenderness plantar base second third metatarsal/sulcus area without any palpable lesions  X-ray examination weightbearing right foot  Intact bony structure without a fracture and/or dislocation  Metatarsus adductus  Posterior calcaneal spur  Radiographic impression:  No acute bony abnormality noted in the right foot       Assessment & Plan:   Assessment: Metatarsalgia second third MPJ right Capsulitis second MPJ right  Plan: Patient placed in Darco shoe to restrict flexion extension of MPJ Deferred on NSAID therapy as patient has a history of bowel disease  If symptoms are not responding to immobilization consider ordering arthritic  panel

## 2013-10-20 ENCOUNTER — Encounter: Payer: Self-pay | Admitting: Podiatry

## 2013-11-10 ENCOUNTER — Other Ambulatory Visit: Payer: Self-pay | Admitting: Gastroenterology

## 2013-11-12 ENCOUNTER — Telehealth: Payer: Self-pay | Admitting: Gastroenterology

## 2013-11-12 ENCOUNTER — Encounter: Payer: Self-pay | Admitting: Gastroenterology

## 2013-11-12 MED ORDER — MESALAMINE ER 500 MG PO CPCR: 500.0000 mg | ORAL_CAPSULE | Freq: Two times a day (BID) | ORAL | Status: DC

## 2013-11-12 NOTE — Telephone Encounter (Signed)
Pt rx sent to last until appt pt aware

## 2013-11-23 ENCOUNTER — Ambulatory Visit: Payer: Federal, State, Local not specified - PPO | Admitting: Podiatry

## 2014-01-11 ENCOUNTER — Encounter: Payer: Self-pay | Admitting: Gastroenterology

## 2014-01-11 ENCOUNTER — Ambulatory Visit (INDEPENDENT_AMBULATORY_CARE_PROVIDER_SITE_OTHER): Payer: Federal, State, Local not specified - PPO | Admitting: Gastroenterology

## 2014-01-11 VITALS — BP 158/110 | HR 80 | Ht 71.0 in | Wt 230.6 lb

## 2014-01-11 DIAGNOSIS — K5090 Crohn's disease, unspecified, without complications: Secondary | ICD-10-CM

## 2014-01-11 NOTE — Patient Instructions (Addendum)
Complete the pentasa that you already have and then stop taking it, without refills.  You will be due for a recall colonoscopy in 04/2014. We will send you a reminder in the mail when it gets closer to that time.

## 2014-01-11 NOTE — Progress Notes (Signed)
Review of gastrointestinal problems:  1. Likely Crohn's disease. Diagnosed in 1984, presenting with diarrhea, anemia, and weight loss. Underwent what sounds like an ileocectomy in 1987, small bowel obstruction surgery 1992. Most recently for the past 10 to 12 years he has been on 500 mg twice daily of Pentasa without any GI symptoms. Most recent colon[place] August 2004 in [place] found normal colon, friable but otherwise normal IC anastomosis. August, 2009 I recommended decreasing his mesalamine small bowel function and lack of any empiric evidence of colonic disease, he declined. 2, Intermittent RUQ pains, 2010: abd US showed no gallstones, normal LFTs, recommended HIDA but he declined.  Eventually agreed to HIDA 2013 was normal.    HPI: This is a  very pleasant 49 year old man whom I last saw about 3-1/2 years ago.  Since then he has been taking 500 mg once daily Pentasa. On that regimen he has been absolutely fine. He has no diarrhea, no abdominal discomforts, no bleeding. He has gained about 15 pounds in the past year.  Last visit was 05/2010  Bowels are fine.  Solid stool 1-2 per day, no bleeding, no diarrhea.  No significant abd pains.    Sees urologist for Kidney stones.   Review of systems: Pertinent positive and negative review of systems were noted in the above HPI section. Complete review of systems was performed and was otherwise normal.    Past Medical History  Diagnosis Date  . Crohn's   . Vitamin B12 deficient megaloblastic anemia     Past Surgical History  Procedure Laterality Date  . Ileocectomy      1987  . Small bowel obstruction surgery      1992  . Foot surgery  08/2009    heal and bone spur  . Achilles tendon repair  08/2009    Current Outpatient Prescriptions  Medication Sig Dispense Refill  . CYANOCOBALAMIN IJ Inject 1,000 mg/mL as directed every 30 (thirty) days.      . mesalamine (PENTASA) 500 MG CR capsule Take 1 capsule (500 mg total) by mouth 2  (two) times daily. 180 capsule 1   No current facility-administered medications for this visit.    Allergies as of 01/11/2014 - Review Complete 01/11/2014  Allergen Reaction Noted  . Aspirin  01/11/2014  . Erythromycin  01/11/2014  . Nsaids  01/11/2014  . Penicillins  08/12/2007    Family History  Problem Relation Age of Onset  . Heart disease Father     History   Social History  . Marital Status: Married    Spouse Name: N/A    Number of Children: N/A  . Years of Education: N/A   Occupational History  . Manager [employer]   Social History Main Topics  . Smoking status: Never Smoker   . Smokeless tobacco: Never Used  . Alcohol Use: Not on file     Comment: OCCASIONALLY  . Drug Use: No  . Sexual Activity: Not on file   Other Topics Concern  . Not on file   Social History Narrative       Physical Exam: BP 158/110 mmHg  Pulse 80  Ht  (1.803 m)  Wt 230 lb 9.6 oz (104.599 kg)  BMI 32.18 kg/m2 Constitutional: generally well-appearing Psychiatric: alert and oriented x3 Eyes: extraocular movements intact Mouth: oral pharynx moist, no lesions Neck: supple no lymphadenopathy Cardiovascular: heart regular rate and rhythm Lungs: clear to auscultation bilaterally Abdomen: soft, nontender, nondistended, no obvious ascites, no peritoneal signs, normal bowel  sounds Extremities: no lower extremity edema bilaterally Skin: no lesions on visible extremities    Assessment and plan: 49 y.o. male with  history of Crohn's ileitis  He is taking an extremely low dose of mesalamine. Additionally mesalamine has been shown to be no more effective than placebo for small bowel Crohn's disease. He has never been found to have Crohn's colitis. Frankly neck in intensity truly had Crohn's in the first place given the fact that he is on nearly no therapy for many years and has been doing fine. Perhaps his Crohn's has simply "burned out". Either way I recommended that he stop  the Pentasa altogether which she will do after he completes the current bottle he is taking. He will be due for colon cancer screening with colonoscopy at the age of 18 in about 3 months from now and we will arrange for that to be done.

## 2014-03-11 ENCOUNTER — Encounter: Payer: Self-pay | Admitting: Gastroenterology

## 2014-09-21 ENCOUNTER — Encounter: Payer: Self-pay | Admitting: Gastroenterology

## 2015-01-12 ENCOUNTER — Ambulatory Visit (INDEPENDENT_AMBULATORY_CARE_PROVIDER_SITE_OTHER): Payer: Federal, State, Local not specified - PPO | Admitting: Podiatry

## 2015-01-12 ENCOUNTER — Encounter: Payer: Self-pay | Admitting: Podiatry

## 2015-01-12 ENCOUNTER — Ambulatory Visit (INDEPENDENT_AMBULATORY_CARE_PROVIDER_SITE_OTHER): Payer: Federal, State, Local not specified - PPO

## 2015-01-12 VITALS — BP 153/108 | HR 74 | Temp 98.7°F | Resp 12

## 2015-01-12 DIAGNOSIS — S91332D Puncture wound without foreign body, left foot, subsequent encounter: Secondary | ICD-10-CM | POA: Diagnosis not present

## 2015-01-12 DIAGNOSIS — R52 Pain, unspecified: Secondary | ICD-10-CM

## 2015-01-12 NOTE — Progress Notes (Signed)
   Subjective:    Patient ID: Cody Maynard, male    DOB: March 21, 1965, 50 y.o.   MRN: [id number]  HPI    Patient presents today for follow-up care for nail puncture occurring approximately 2 weeks ago on the plantar aspect left foot. He describes when wearing shoes a thin nail penetrating the plantar left foot through the shoe and into the plantar left forefoot area notice some bleeding the area. The day after the puncture wound patient presented to his family doctor who gave him a tetanus shot and prescribed 2 weeks of oral antibiotics which patient has completed. Now patient describes some persistent pain in or around the puncture site, plantar lateral aspect left foot as well as the dorsal lateral aspect left foot. He said he noticed some swelling the other day, however, when he wear his compression hose the swelling resolved and he does not notice any swelling. He missed that he walks and somewhat of a painful manner shifting his weight on the left foot to the more lateral aspect of the foot. He states that when he wears a crock-like shoe with a robbery bottom the foot pain is minimal. Overall he says the symptoms have improved since the initial injury  Review of Systems  Musculoskeletal: Positive for gait problem.  Skin: Positive for wound.       Objective:   Physical Exam  BP 153/108 Pulse 74 Respiration 12 Temperature 98.7 Fahrenheit  Patient is orientated 3  Vascular: No peripheral edema noted bilaterally DP and PT pulses 2/4 bilaterally Capillary reflex immediate bilaterally  Neurological: Sensation to 10 g monofilament wire intact 5/5 bilaterally Vibratory sensation reactive bilaterally Ankle reflex equal and reactive bilaterally  Dermatological: Small eschar plantar proximal fourth MPJ which is the area of nail puncture. Palpation in this area demonstrates no fluctuance or palpable lesions. There is slightly tender to palpation. Plantar callus second and third  MPJ left Plantar callus third MPJ right  Musculoskeletal: Hammertoe second left Palpation in or around the plantar puncture area demonstrates slight tenderness without any fluctuance or palpable lesions Area of maximum tenderness is plantar fourth MPJ without a palpable lesions The plantar callus second third MPJ are slightly tender to palpation  X-ray examination weightbearing left foot dated 01/12/2015  History of nail puncture left forefoot proximally 2 weeks prior Intact bony structure without fracture and/or dislocation No evidence of increased soft tissue density No emphysema noted Hammertoe second Bunionette Posterior calcaneal spur  Radiographic impression: No acute bony abnormality noted left foot No evidence of increased soft tissue density noted in the left foot     Assessment & Plan:   Assessment: No clinical sign of infection at this time Residual tenderness in puncture site plantar left resulting in altered gait Altered gait resulting in weight transfer laterally causing some discomfort in the plantar fourth MPJ and dorsal lateral aspect left foot  Plan: Today review the results of the exam and x-ray with patient today. I made aware that I saw no clinical sign of infection or bone activity at this time. I recommended that he wear they crock-like shoe on ongoing basis. I recommended he  observe the foot to see if there is any signs of increased pain, swelling, redness and if so present for further evaluation.  Reappoint at patient's request

## 2015-01-12 NOTE — Patient Instructions (Signed)
Today your exam demonstrated no clinical sign of infection The foot x-ray, left foot demonstrated no bone activity or increased soft tissue swelling Because of the location of the wound most likely your shifting your way towards the little toe side of your foot causing a strain in the left foot Continue to observe the area and return if you notice any increase of pain, swelling, redness, warmth or fever

## 2016-04-20 ENCOUNTER — Encounter: Payer: Self-pay | Admitting: Endocrinology

## 2017-03-08 ENCOUNTER — Encounter: Payer: Self-pay | Admitting: Family Medicine

## 2017-04-15 ENCOUNTER — Ambulatory Visit (INDEPENDENT_AMBULATORY_CARE_PROVIDER_SITE_OTHER): Payer: Federal, State, Local not specified - PPO | Admitting: Orthopedic Surgery

## 2017-04-15 ENCOUNTER — Ambulatory Visit (INDEPENDENT_AMBULATORY_CARE_PROVIDER_SITE_OTHER): Payer: Federal, State, Local not specified - PPO

## 2017-04-15 ENCOUNTER — Encounter (INDEPENDENT_AMBULATORY_CARE_PROVIDER_SITE_OTHER): Payer: Self-pay | Admitting: Orthopedic Surgery

## 2017-04-15 VITALS — Ht 71.0 in | Wt 230.0 lb

## 2017-04-15 DIAGNOSIS — G8929 Other chronic pain: Secondary | ICD-10-CM | POA: Insufficient documentation

## 2017-04-15 DIAGNOSIS — M6702 Short Achilles tendon (acquired), left ankle: Secondary | ICD-10-CM | POA: Insufficient documentation

## 2017-04-15 DIAGNOSIS — M25561 Pain in right knee: Secondary | ICD-10-CM | POA: Diagnosis not present

## 2017-04-15 MED ORDER — LIDOCAINE HCL 1 % IJ SOLN
5.0000 mL | INTRAMUSCULAR | Status: AC | PRN
  Administered 2017-04-15: 5 mL

## 2017-04-15 MED ORDER — METHYLPREDNISOLONE ACETATE 40 MG/ML IJ SUSP
40.0000 mg | INTRAMUSCULAR | Status: AC | PRN
  Administered 2017-04-15: 40 mg via INTRA_ARTICULAR

## 2017-04-15 NOTE — Progress Notes (Signed)
Office Visit Note   Patient: Cody Maynard           Date of Birth: 1965-12-14           MRN: [id number] Visit Date: 04/15/2017              Requested by: Verl Bangs, MD [address] [place], [place] [address] PCP: Verl Bangs, MD  Chief Complaint  Patient presents with  . Right Knee - Pain  . Left Foot - Follow-up    Heel pain       HPI: Patient is a 52 year old gentleman who presents complaining of insertional Achilles tendon pain on the left and medial meniscal pain of the right knee which is been going on for several weeks.  Patient denies any catching or locking but he states it feels like it is trying to catch and lock.  Assessment & Plan: Visit Diagnoses:  1. Chronic pain of right knee   2. Achilles tendon contracture, left     Plan: Right knee injected follow-up in 4 weeks.  If he still symptomatic we will obtain an MRI scan to evaluate for possible medial meniscal tear.  Patient was given instructions and demonstrated heel cord stretching of the left heel do this 5 times a day a minute at a time patient may require gastrocnemius recession.  Follow-Up Instructions: Return in about 1 month (around 05/13/2017).   Ortho Exam  Patient is alert, oriented, no adenopathy, well-dressed, normal affect, normal respiratory effort. Examination patient has an antalgic gait.  He is point tender to palpation over the medial meniscus right knee there is no effusion no redness no cellulitis he has pain with flexion and internal rotation collaterals and cruciates are stable.  Examination of the left ankle he is tender at the insertion of the Achilles there is no nodular changes no swelling no palpable defects.  He has significant heel cord contracture with his knee extended he lacks 20 degrees of dorsiflexion to neutral of the left ankle.  Patient's radiographs shows approximately 5 mm calcaneal bony spur at the insertion of the Achilles.  Imaging: Xr Knee  1-2 Views Right  Result Date: 04/15/2017 2 view radiographs of the right knee shows a congruent joint space no osteophytic bone spurs.  There is some calcification of the distal metaphysis of the femur and proximal metaphysis of the tibia.  No destructive bony changes.  No images are attached to the encounter.  Labs: Lab Results  Component Value Date   ESRSEDRATE 21 05/14/2008   ESRSEDRATE 12 08/12/2007    @LABSALLVALUES (HGBA1)@  Body mass index is 32.08 kg/m.  Orders:  Orders Placed This Encounter  Procedures  . XR Knee 1-2 Views Right   No orders of the defined types were placed in this encounter.    Procedures: Large Joint Inj: R knee on 04/15/2017 10:07 AM Indications: pain and diagnostic evaluation Details: 22 G 1.5 in needle, anteromedial approach  Arthrogram: No  Medications: 5 mL lidocaine 1 %; 40 mg methylPREDNISolone acetate 40 MG/ML Outcome: tolerated well, no immediate complications Procedure, treatment alternatives, risks and benefits explained, specific risks discussed. Consent was given by the patient. Immediately prior to procedure a time out was called to verify the correct patient, procedure, equipment, support staff and site/side marked as required. Patient was prepped and draped in the usual sterile fashion.      Clinical Data: No additional findings.  ROS:  All other systems negative, except as noted in the HPI. Review of Systems  Objective: Vital Signs: Ht 5\' 11"  (1.803 m)   Wt 230 lb (104.3 kg)   BMI 32.08 kg/m   Specialty Comments:  No specialty comments available.  PMFS History: Patient Active Problem List   Diagnosis Date Noted  . Chronic pain of right knee 04/15/2017  . Achilles tendon contracture, left 04/15/2017  . Nephrolithiasis 06/01/2013  . Hyperparathyroidism (HCC) 06/01/2013  . RUQ PAIN 05/14/2008  . [facility] INTESTINE 08/12/2007  . ANEMIA 04/26/2007  . BOWEL OBSTRUCTION 04/26/2007  . DIARRHEA  04/26/2007   Past Medical History:  Diagnosis Date  . Crohn's   . Vitamin B12 deficient megaloblastic anemia     Family History  Problem Relation Age of Onset  . Heart disease Father     Past Surgical History:  Procedure Laterality Date  . ACHILLES TENDON REPAIR  08/2009  . FOOT SURGERY  08/2009   heal and bone spur  . ILEOCECTOMY     1987  . SMALL BOWEL OBSTRUCTION SURGERY     1992   Social History   Occupational History  . Occupation: Event organiser: [employer]  Tobacco Use  . Smoking status: Never Smoker  . Smokeless tobacco: Never Used  Substance and Sexual Activity  . Alcohol use: Not on file    Comment: OCCASIONALLY  . Drug use: No  . Sexual activity: Not on file

## 2017-05-13 ENCOUNTER — Ambulatory Visit (INDEPENDENT_AMBULATORY_CARE_PROVIDER_SITE_OTHER): Payer: Federal, State, Local not specified - PPO | Admitting: Orthopedic Surgery

## 2017-06-13 ENCOUNTER — Ambulatory Visit (INDEPENDENT_AMBULATORY_CARE_PROVIDER_SITE_OTHER): Payer: Federal, State, Local not specified - PPO | Admitting: Orthopedic Surgery

## 2017-06-13 ENCOUNTER — Ambulatory Visit (INDEPENDENT_AMBULATORY_CARE_PROVIDER_SITE_OTHER): Payer: Federal, State, Local not specified - PPO

## 2017-06-13 ENCOUNTER — Encounter (INDEPENDENT_AMBULATORY_CARE_PROVIDER_SITE_OTHER): Payer: Self-pay | Admitting: Orthopedic Surgery

## 2017-06-13 VITALS — Ht 71.0 in | Wt 230.0 lb

## 2017-06-13 DIAGNOSIS — M25562 Pain in left knee: Secondary | ICD-10-CM | POA: Diagnosis not present

## 2017-06-13 DIAGNOSIS — G8929 Other chronic pain: Secondary | ICD-10-CM | POA: Diagnosis not present

## 2017-06-13 MED ORDER — METHYLPREDNISOLONE ACETATE 40 MG/ML IJ SUSP
40.0000 mg | INTRAMUSCULAR | Status: AC | PRN
Start: 2017-06-13 — End: 2017-06-13
  Administered 2017-06-13: 40 mg via INTRA_ARTICULAR

## 2017-06-13 MED ORDER — LIDOCAINE HCL 1 % IJ SOLN
5.0000 mL | INTRAMUSCULAR | Status: AC | PRN
  Administered 2017-06-13: 5 mL

## 2017-06-13 NOTE — Progress Notes (Signed)
Office Visit Note   Patient: Cody Maynard           Date of Birth: Dec 25, 1965           MRN: [id number] Visit Date: 06/13/2017              Requested by: Verl Bangs, MD [address] [place], [place] [address] PCP: Verl Bangs, MD  Chief Complaint  Patient presents with  . Left Knee - Pain  . Right Knee - Follow-up    S/p injection 04/09/17      HPI: Patient is a 51 year old gentleman who presents with left knee pain.  He is status post a steroid injection for the right knee which has completely relieve the right knee symptoms.  Left knee pain is on the medial joint line worse with twisting.   Assessment & Plan: Visit Diagnoses:  1. Chronic pain of left knee     Plan: Left knee was injected without complications follow-up as needed.  Follow-Up Instructions: Return if symptoms worsen or fail to improve.   Ortho Exam  Patient is alert, oriented, no adenopathy, well-dressed, normal affect, normal respiratory effort. Examination patient has a normal gait.  He has mild crepitation with range of motion of the left knee he has pain with flexion and rotation along the medial joint line medial joint line is tender to palpation there is no effusion.  Collaterals and cruciates are stable.  Imaging: Xr Knee 1-2 Views Left  Result Date: 06/13/2017 2 view radiographs of the left knee show some small spurs in the patellofemoral joint medial and lateral joint line shows no narrowing.  There is some calcification of the cartilage within the metaphyseal region.  No subcondylar sclerosis or cysts.  No images are attached to the encounter.  Labs: Lab Results  Component Value Date   ESRSEDRATE 21 05/14/2008   ESRSEDRATE 12 08/12/2007     Lab Results  Component Value Date   ALBUMIN 3.9 05/03/2010   ALBUMIN 4.0 05/14/2008   ALBUMIN 4.5 08/12/2007    Body mass index is 32.08 kg/m.  Orders:  Orders Placed This Encounter  Procedures  . Large Joint  Inj  . XR Knee 1-2 Views Left   No orders of the defined types were placed in this encounter.    Procedures: Large Joint Inj: L knee on 06/13/2017 8:20 AM Indications: pain and diagnostic evaluation Details: 22 G 1.5 in needle, anteromedial approach  Arthrogram: No  Medications: 5 mL lidocaine 1 %; 40 mg methylPREDNISolone acetate 40 MG/ML Outcome: tolerated well, no immediate complications Procedure, treatment alternatives, risks and benefits explained, specific risks discussed. Consent was given by the patient. Immediately prior to procedure a time out was called to verify the correct patient, procedure, equipment, support staff and site/side marked as required. Patient was prepped and draped in the usual sterile fashion.      Clinical Data: No additional findings.  ROS:  All other systems negative, except as noted in the HPI. Review of Systems  Objective: Vital Signs: Ht 5\' 11"  (1.803 m)   Wt 230 lb (104.3 kg)   BMI 32.08 kg/m   Specialty Comments:  No specialty comments available.  PMFS History: Patient Active Problem List   Diagnosis Date Noted  . Chronic pain of right knee 04/15/2017  . Achilles tendon contracture, left 04/15/2017  . Nephrolithiasis 06/01/2013  . Hyperparathyroidism (HCC) 06/01/2013  . RUQ PAIN 05/14/2008  . [facility] INTESTINE 08/12/2007  . ANEMIA 04/26/2007  . BOWEL OBSTRUCTION  04/26/2007  . DIARRHEA 04/26/2007   Past Medical History:  Diagnosis Date  . Crohn's   . Vitamin B12 deficient megaloblastic anemia     Family History  Problem Relation Age of Onset  . Heart disease Father     Past Surgical History:  Procedure Laterality Date  . ACHILLES TENDON REPAIR  08/2009  . FOOT SURGERY  08/2009   heal and bone spur  . ILEOCECTOMY     1987  . SMALL BOWEL OBSTRUCTION SURGERY     1992   Social History   Occupational History  . Occupation: Event organiser: [employer]  Tobacco Use  . Smoking status: Never  Smoker  . Smokeless tobacco: Never Used  Substance and Sexual Activity  . Alcohol use: Not on file    Comment: OCCASIONALLY  . Drug use: No  . Sexual activity: Not on file

## 2017-08-01 ENCOUNTER — Other Ambulatory Visit: Payer: Self-pay | Admitting: Podiatry

## 2017-08-01 ENCOUNTER — Encounter: Payer: Self-pay | Admitting: Podiatry

## 2017-08-01 ENCOUNTER — Ambulatory Visit (INDEPENDENT_AMBULATORY_CARE_PROVIDER_SITE_OTHER): Payer: Federal, State, Local not specified - PPO

## 2017-08-01 ENCOUNTER — Ambulatory Visit: Payer: Federal, State, Local not specified - PPO | Admitting: Podiatry

## 2017-08-01 DIAGNOSIS — R600 Localized edema: Secondary | ICD-10-CM

## 2017-08-01 DIAGNOSIS — M722 Plantar fascial fibromatosis: Secondary | ICD-10-CM

## 2017-08-01 DIAGNOSIS — M779 Enthesopathy, unspecified: Secondary | ICD-10-CM | POA: Diagnosis not present

## 2017-08-01 DIAGNOSIS — M84374A Stress fracture, right foot, initial encounter for fracture: Secondary | ICD-10-CM | POA: Diagnosis not present

## 2017-08-01 DIAGNOSIS — M21961 Unspecified acquired deformity of right lower leg: Secondary | ICD-10-CM | POA: Diagnosis not present

## 2017-08-01 DIAGNOSIS — M79671 Pain in right foot: Secondary | ICD-10-CM

## 2017-08-01 MED ORDER — PREDNISONE 10 MG PO TABS: ORAL_TABLET | ORAL | 0 refills | Status: DC

## 2017-08-01 MED ORDER — METHYLPREDNISOLONE 4 MG PO TBPK
ORAL_TABLET | ORAL | 0 refills | Status: DC
Start: 2017-08-01 — End: 2017-08-01

## 2017-08-01 NOTE — Telephone Encounter (Addendum)
I spoke to the [facility] staff and they do not have medrol dose packs. I routed message to Dr. Al Corpus for an alternative medication.

## 2017-08-01 NOTE — Telephone Encounter (Signed)
Pharmacy has some questions about medication please call.

## 2017-08-01 NOTE — Addendum Note (Signed)
Addended by: Alphia Kava D on: 08/01/2017 05:28 PM   Modules accepted: Orders

## 2017-08-01 NOTE — Telephone Encounter (Signed)
That will be fine for a taper dose.

## 2017-08-01 NOTE — Addendum Note (Signed)
Addended by: Alphia Kava D on: 08/01/2017 04:58 PM   Modules accepted: Orders

## 2017-08-15 ENCOUNTER — Encounter: Payer: Self-pay | Admitting: Podiatry

## 2017-08-15 ENCOUNTER — Ambulatory Visit: Payer: Federal, State, Local not specified - PPO | Admitting: Podiatry

## 2017-08-15 DIAGNOSIS — M779 Enthesopathy, unspecified: Secondary | ICD-10-CM

## 2017-08-15 DIAGNOSIS — M21961 Unspecified acquired deformity of right lower leg: Secondary | ICD-10-CM

## 2017-08-21 NOTE — Progress Notes (Deleted)
  Subjective:  Patient ID: Cody Maynard, male    DOB: October 13, 1965,  MRN: [id number]  Chief Complaint  Patient presents with  . Capsulitis    right foot follow up; pt stated, "doing better, feels like 90%, still little tender but swelling has gone down alot"    52 y.o. male presents with the above complaint. ***   Review of Systems: Negative except as noted in the HPI. Denies N/V/F/Ch.  Past Medical History:  Diagnosis Date  . Crohn's   . Vitamin B12 deficient megaloblastic anemia     Current Outpatient Medications:  .  CYANOCOBALAMIN IJ, Inject 1,000 mg/mL as directed every 30 (thirty) days.  , Disp: , Rfl:  .  methylPREDNISolone (MEDROL DOSEPAK) 4 MG TBPK tablet, , Disp: , Rfl:  .  predniSONE (DELTASONE) 10 MG tablet, Take as directed., Disp: 21 tablet, Rfl: 0 .  valsartan-hydrochlorothiazide (DIOVAN-HCT) 160-25 MG tablet, Take by mouth., Disp: , Rfl:  .  metoprolol succinate (TOPROL-XL) 25 MG 24 hr tablet, Take 25 mg by mouth., Disp: , Rfl:   Social History  Tobacco Use  Smoking Status Never Smoker  Smokeless Tobacco Never Used    Allergies  Allergen Reactions  . Aspirin     "GI disress" "GI disress"  . Erythromycin     "GI-distress"  . Nsaids     Exacerbate crohns  . Penicillins     REACTION: sob   Objective:  There were no vitals filed for this visit. There is no height or weight on file to calculate BMI. Constitutional Well developed. Well nourished.  Vascular Dorsalis pedis pulses palpable bilaterally. Posterior tibial pulses palpable bilaterally. Capillary refill normal to all digits.  No cyanosis or clubbing noted. Pedal hair growth normal.  Neurologic Normal speech. Oriented to person, place, and time. Epicritic sensation to light touch grossly present bilaterally.  Dermatologic Nails well groomed and normal in appearance. No open wounds. No skin lesions.  Orthopedic: Normal joint ROM without pain or crepitus bilaterally. No visible  deformities. No bony tenderness.   Radiographs: *** Assessment:  No diagnosis found. Plan:  Patient was evaluated and treated and all questions answered.  *** -  Return if symptoms worsen or fail to improve.

## 2017-08-31 NOTE — Progress Notes (Signed)
  Subjective:  Patient ID: Cody Maynard, male    DOB: [birth year],  MRN: [id number]  Chief Complaint  Patient presents with  . Capsulitis    right foot follow up; pt stated, "doing better, feels like 90%, still little tender but swelling has gone down alot"    52 y.o. male presents with the above complaint.  States is doing much better.  Review of Systems: Negative except as noted in the HPI. Denies N/V/F/Ch.  Past Medical History:  Diagnosis Date  . Crohn's   . Vitamin B12 deficient megaloblastic anemia     Current Outpatient Medications:  .  CYANOCOBALAMIN IJ, Inject 1,000 mg/mL as directed every 30 (thirty) days.  , Disp: , Rfl:  .  methylPREDNISolone (MEDROL DOSEPAK) 4 MG TBPK tablet, , Disp: , Rfl:  .  predniSONE (DELTASONE) 10 MG tablet, Take as directed., Disp: 21 tablet, Rfl: 0 .  valsartan-hydrochlorothiazide (DIOVAN-HCT) 160-25 MG tablet, Take by mouth., Disp: , Rfl:  .  metoprolol succinate (TOPROL-XL) 25 MG 24 hr tablet, Take 25 mg by mouth., Disp: , Rfl:   Social History   Tobacco Use  Smoking Status Never Smoker  Smokeless Tobacco Never Used    Allergies  Allergen Reactions  . Aspirin     "GI disress" "GI disress"  . Erythromycin     "GI-distress"  . Nsaids     Exacerbate crohns  . Penicillins     REACTION: sob   Objective:  There were no vitals filed for this visit. There is no height or weight on file to calculate BMI. Constitutional Well developed. Well nourished.  Vascular Dorsalis pedis pulses palpable bilaterally. Posterior tibial pulses palpable bilaterally. Capillary refill normal to all digits.  No cyanosis or clubbing noted. Pedal hair growth normal.  Neurologic Normal speech. Oriented to person, place, and time. Epicritic sensation to light touch grossly present bilaterally.  Dermatologic Nails well groomed and normal in appearance. No open wounds. No skin lesions.  Orthopedic: Normal joint ROM without pain or crepitus  bilaterally. No visible deformities. No pain to palpation for the fourth fifth metatarsals   Radiographs: None taken today Assessment:   1. Capsulitis   2. Deformity of metatarsal bone of right foot    Plan:  Patient was evaluated and treated and all questions answered.  Inflammatory Capsulitis  -Gradual return to activity slowly transition out of boot.  Follow-up as needed for further issues  Return if symptoms worsen or fail to improve.

## 2017-08-31 NOTE — Progress Notes (Signed)
  Subjective:  Patient ID: Cody Maynard, male    DOB: 11/23/1965,  MRN: [id number]  Chief Complaint  Patient presents with  . Foot Pain    right foot lateral; pt stated, "3 wks ago stepped on a rock and pain went from top of foot to side; have some swelling, and painful to walk"    52 y.o. male presents with the above complaint.  Denies other complaints  Review of Systems: Negative except as noted in the HPI. Denies N/V/F/Ch.  Past Medical History:  Diagnosis Date  . Crohn's   . Vitamin B12 deficient megaloblastic anemia     Current Outpatient Medications:  .  valsartan-hydrochlorothiazide (DIOVAN-HCT) 160-25 MG tablet, Take by mouth., Disp: , Rfl:  .  CYANOCOBALAMIN IJ, Inject 1,000 mg/mL as directed every 30 (thirty) days.  , Disp: , Rfl:  .  methylPREDNISolone (MEDROL DOSEPAK) 4 MG TBPK tablet, , Disp: , Rfl:  .  metoprolol succinate (TOPROL-XL) 25 MG 24 hr tablet, Take 25 mg by mouth., Disp: , Rfl:  .  predniSONE (DELTASONE) 10 MG tablet, Take as directed., Disp: 21 tablet, Rfl: 0  Social History   Tobacco Use  Smoking Status Never Smoker  Smokeless Tobacco Never Used    Allergies  Allergen Reactions  . Aspirin     "GI disress" "GI disress"  . Erythromycin     "GI-distress"  . Nsaids     Exacerbate crohns  . Penicillins     REACTION: sob   Objective:  There were no vitals filed for this visit. There is no height or weight on file to calculate BMI. Constitutional Well developed. Well nourished.  Vascular Dorsalis pedis pulses palpable bilaterally. Posterior tibial pulses palpable bilaterally. Capillary refill normal to all digits.  No cyanosis or clubbing noted. Pedal hair growth normal.  Neurologic Normal speech. Oriented to person, place, and time. Epicritic sensation to light touch grossly present bilaterally.  Dermatologic Nails well groomed and normal in appearance. No open wounds. No skin lesions.  Orthopedic: Normal joint ROM without pain  or crepitus bilaterally. No visible deformities. Pain to palpation for the fourth fifth metatarsals   Radiographs: Taken reviewed no acute fractures dislocations.  Possible subtle cortical irregularity fourth fifth metatarsals Assessment:   1. Capsulitis   2. Deformity of metatarsal bone of right foot   3. Stress fracture of right foot, initial encounter   4. Localized edema    Plan:  Patient was evaluated and treated and all questions answered.  Inflammatory Capsulitis  -Soft cast consisting of Unna boot and Coban applied today. -Short cam boot dispensed; concern for possible stress fracture -Rx for Medrol Dosepak  Return in about 2 weeks (around 08/15/2017) for Capsulitis, Right.

## 2018-01-20 ENCOUNTER — Encounter: Payer: Self-pay | Admitting: Gastroenterology

## 2018-02-17 ENCOUNTER — Encounter: Payer: Federal, State, Local not specified - PPO | Admitting: Gastroenterology

## 2018-03-26 ENCOUNTER — Encounter: Payer: Self-pay | Admitting: Gastroenterology

## 2018-04-01 ENCOUNTER — Ambulatory Visit (INDEPENDENT_AMBULATORY_CARE_PROVIDER_SITE_OTHER): Payer: Federal, State, Local not specified - PPO | Admitting: Family Medicine

## 2018-04-01 ENCOUNTER — Encounter (INDEPENDENT_AMBULATORY_CARE_PROVIDER_SITE_OTHER): Payer: Self-pay | Admitting: Family Medicine

## 2018-04-01 ENCOUNTER — Other Ambulatory Visit: Payer: Self-pay

## 2018-04-01 DIAGNOSIS — M79671 Pain in right foot: Secondary | ICD-10-CM | POA: Diagnosis not present

## 2018-04-01 DIAGNOSIS — M79672 Pain in left foot: Secondary | ICD-10-CM | POA: Diagnosis not present

## 2018-04-01 MED ORDER — NITROGLYCERIN 0.1 MG/HR TD PT24: MEDICATED_PATCH | TRANSDERMAL | 3 refills | Status: AC

## 2018-04-01 MED ORDER — TRAMADOL HCL 50 MG PO TABS: 50.0000 mg | ORAL_TABLET | Freq: Every evening | ORAL | 0 refills | Status: AC | PRN

## 2018-04-01 NOTE — Progress Notes (Signed)
  Cody Maynard - 53 y.o. male MRN [id number]  Date of birth: Mar 20, 1965    SUBJECTIVE:      Chief Complaint: right heel pain, left foot pain  HPI:  53 yr old male with 1 week of posterior left heel pain and lateral right foot pain.  Pain initially started with the posterior right heel which caused him to alter his gait and his foot pain followed shortly after.  He reports having a heel spur that causes him pain.  He localizes his heel pain over the posterior calcaneus at the insertion of the Achilles.  He feels that has been swollen which is worse at the end of the day.  He denies any specific injury.  He denies any issue with range of motion. He localizes his right foot pain to near the base of the fifth metatarsal.  He has been wearing a short cam walker for the past 2 days because of pain.  He denies any specific injury to his foot.  He denies feeling any pop. He does report having some diffuse swelling in the feet bilaterally.  No focal swelling or erythema.  No bruising/ecchymosis.  No numbness or tingling. His pain does make it difficult to sleep at night.    ROS:     See HPI. All other reviewed systems negative.  PERTINENT  PMH / PSH FH / / SH:  Past Medical, Surgical, Social, and Family History Reviewed & Updated in the EMR.     OBJECTIVE: There were no vitals taken for this visit.  Physical Exam:  Vital signs are reviewed.  GEN: Alert and oriented, NAD Pulm: Breathing unlabored PSY: normal mood, congruent affect  MSK: Left foot/Ankle: - Inspection: mild Haglund's deformity.  He does have some pedal edema.  No focal swelling over the Achilles. - Palpation: Tenderness at the insertion of the Achilles tendon - Strength: Normal strength with dorsiflexion, plantarflexion, inversion, and eversion - ROM: Full ROM - Neuro/vasc: NV intact  MSK [place]: Limited ultrasound of the left Achilles tendon performed.  There is no thickening of the tendon, measuring 5 mm in thickness.   There is a small heel spur noted.  At the insertion Achilles, there is slight hypoechoic change consistent with mild edema.  No focal tearing visualized.  Right foot/Ankle: - Inspection: Trace pedal edema.  No focal swelling or erythema or bruising - Palpation: Patient has tenderness around the base of the fifth.  This extends from just proximal to the base of the fifth MT to approximately the midshaft - Strength: Normal strength with dorsiflexion, plantarflexion, inversion, and eversion.  Reproduced pain with resisted eversion - ROM: Full ROM. - Neuro/vasc: NV intact  MSK [place]: Limited ultrasound of the right foot performed.  The fifth metatarsal shows no acute cortical irregularities or swelling.  There is mild tenosynovitis of the peroneus brevis.  Transference is intact to its insertion.  There is slight thickening of the tendon noted   ASSESSMENT & PLAN:  1.  Left heel pain-secondary to mild insertional Achilles tendinitis.  No focal tears visualized on ultrasound. - Rehab exercise - Nitroglycerin patches -Follow-up as needed   2.  Right foot pain-secondary to peroneus brevis tendinitis.  Thickening but no tears visualized on ultrasound. - May wear cam walker as needed but encouraged to wear this too frequently -Home exercise - Tramadol night for severe pain -Follow-up as needed

## 2018-04-01 NOTE — Progress Notes (Signed)
I saw and examined the patient with Dr. Jamse Mead and agree with assessment and plan as outlined.  Right foot peroneus brevis tendinopathy, cannot rule out 5th MT stress fracture.  Left Achilles tendinopathy.  PT referral, nitro-dur patch.  Follow up as needed.

## 2019-03-08 ENCOUNTER — Ambulatory Visit: Payer: Federal, State, Local not specified - PPO | Attending: Internal Medicine

## 2019-03-08 DIAGNOSIS — Z23 Encounter for immunization: Secondary | ICD-10-CM | POA: Insufficient documentation

## 2019-03-08 NOTE — Progress Notes (Signed)
   Covid-19 Vaccination Clinic  Name:  Garrin Kirwan    MRN: [id number] DOB: [birth year]  03/08/2019  Mr. Lupu was observed post Covid-19 immunization for 30 minutes based on pre-vaccination screening without incident. He was provided with Vaccine Information Sheet and instruction to access the V-Safe system.   Mr. Osment was instructed to call 911 with any severe reactions post vaccine: Marland Kitchen Difficulty breathing  . Swelling of face and throat  . A fast heartbeat  . A bad rash all over body  . Dizziness and weakness   Immunizations Administered    Name Date Dose VIS Date Route   Pfizer COVID-19 Vaccine 03/08/2019  5:59 PM 0.3 mL 12/12/2018 Intramuscular   Manufacturer: [employer], [employer]   Lot: [id number]   NDC: [address]-5

## 2019-04-08 ENCOUNTER — Ambulatory Visit: Payer: Federal, State, Local not specified - PPO | Attending: Internal Medicine

## 2019-04-08 DIAGNOSIS — Z23 Encounter for immunization: Secondary | ICD-10-CM

## 2019-04-08 NOTE — Progress Notes (Signed)
   Covid-19 Vaccination Clinic  Name:  Cody Maynard    MRN: [id number] DOB: 1965/08/29  04/08/2019  [name] was observed post Covid-19 immunization for 15 minutes without incident. He was provided with Vaccine Information Sheet and instruction to access the V-Safe system.   Mr. Sites was instructed to call 911 with any severe reactions post vaccine: Marland Kitchen Difficulty breathing  . Swelling of face and throat  . A fast heartbeat  . A bad rash all over body  . Dizziness and weakness   Immunizations Administered    Name Date Dose VIS Date Route   Pfizer COVID-19 Vaccine 04/08/2019  3:35 PM 0.3 mL 12/12/2018 Intramuscular   Manufacturer: [employer], [employer]   Lot: [id number]   NDC: [address]-8
